# Patient Record
Sex: Male | Born: 1970 | Race: White | Hispanic: No | Marital: Married | Smoking: Current every day smoker
Health system: Southern US, Community
[De-identification: ages and names within clinical notes are randomized; demographics above are authoritative.]

## PROBLEM LIST (undated history)

## (undated) DIAGNOSIS — Z789 Other specified health status: Secondary | ICD-10-CM

## (undated) HISTORY — PX: NO PAST SURGERIES: SHX2092

---

## 1998-07-12 ENCOUNTER — Encounter: Payer: Self-pay | Admitting: Emergency Medicine

## 1998-07-12 ENCOUNTER — Emergency Department (HOSPITAL_COMMUNITY): Admission: EM | Admit: 1998-07-12 | Discharge: 1998-07-12 | Payer: Self-pay | Admitting: Emergency Medicine

## 1998-07-21 ENCOUNTER — Emergency Department (HOSPITAL_COMMUNITY): Admission: EM | Admit: 1998-07-21 | Discharge: 1998-07-21 | Payer: Self-pay

## 2013-08-20 ENCOUNTER — Other Ambulatory Visit: Payer: Self-pay | Admitting: Physical Medicine and Rehabilitation

## 2013-08-20 DIAGNOSIS — M47816 Spondylosis without myelopathy or radiculopathy, lumbar region: Secondary | ICD-10-CM

## 2013-08-24 ENCOUNTER — Inpatient Hospital Stay: Admission: RE | Admit: 2013-08-24 | Payer: Self-pay | Source: Ambulatory Visit

## 2013-08-24 ENCOUNTER — Other Ambulatory Visit: Payer: Self-pay

## 2013-09-23 ENCOUNTER — Other Ambulatory Visit: Payer: Self-pay | Admitting: Neurosurgery

## 2013-10-16 ENCOUNTER — Encounter (HOSPITAL_COMMUNITY): Payer: Self-pay | Admitting: Pharmacy Technician

## 2013-10-19 ENCOUNTER — Other Ambulatory Visit (HOSPITAL_COMMUNITY): Payer: Self-pay | Admitting: *Deleted

## 2013-10-19 ENCOUNTER — Inpatient Hospital Stay (HOSPITAL_COMMUNITY)
Admission: RE | Admit: 2013-10-19 | Discharge: 2013-10-19 | Disposition: A | Discharge status: Home / Self Care | Payer: Self-pay | Source: Ambulatory Visit

## 2013-10-19 NOTE — Pre-Procedure Instructions (Signed)
Ricky Williamson  10/19/2013   Your procedure is scheduled on:  10/26/13  Report to [facility] cone short stay admitting at 530 AM.  Call this number if you have problems the morning of surgery: [phone]   Remember:   Do not eat food or drink liquids after midnight.   Take these medicines the morning of surgery with A SIP OF WATER: allegra, pain med , eye drops if needed     STOP all herbel meds, nsaids (aleve,naproxen,advil,ibuprofen) 5 days prior to surgery including aspirin, vitamins,naproxen, ibuprofen(10/21/13)   Do not wear jewelry, make-up or nail polish.  Do not wear lotions, powders, or perfumes. You may wear deodorant.  Do not shave 48 hours prior to surgery. Men may shave face and neck.  Do not bring valuables to the hospital.  [facility] is not responsible                  for any belongings or valuables.               Contacts, dentures or bridgework may not be worn into surgery.  Leave suitcase in the car. After surgery it may be brought to your room.  For patients admitted to the hospital, discharge time is determined by your                treatment team.               Patients discharged the day of surgery will not be allowed to drive  home.  Name and phone number of your driver:   Special Instructions:  Special Instructions: [place] - Preparing for Surgery  Before surgery, you can play an important role.  Because skin is not sterile, your skin needs to be as free of germs as possible.  You can reduce the number of germs on you skin by washing with CHG (chlorahexidine gluconate) soap before surgery.  CHG is an antiseptic cleaner which kills germs and bonds with the skin to continue killing germs even after washing.  Please DO NOT use if you have an allergy to CHG or antibacterial soaps.  If your skin becomes reddened/irritated stop using the CHG and inform your nurse when you arrive at Short Stay.  Do not shave (including legs and underarms) for at least 48 hours  prior to the first CHG shower.  You may shave your face.  Please follow these instructions carefully:   1.  Shower with CHG Soap the night before surgery and the morning of Surgery.  2.  If you choose to wash your hair, wash your hair first as usual with your normal shampoo.  3.  After you shampoo, rinse your hair and body thoroughly to remove the Shampoo.  4.  Use CHG as you would any other liquid soap.  You can apply chg directly  to the skin and wash gently with scrungie or a clean washcloth.  5.  Apply the CHG Soap to your body ONLY FROM THE NECK DOWN.  Do not use on open wounds or open sores.  Avoid contact with your eyes ears, mouth and genitals (private parts).  Wash genitals (private parts)       with your normal soap.  6.  Wash thoroughly, paying special attention to the area where your surgery will be performed.  7.  Thoroughly rinse your body with warm water from the neck down.  8.  DO NOT shower/wash with your normal soap after using and rinsing off  the CHG Soap.  9.  Pat yourself dry with a clean towel.            10.  Wear clean pajamas.            11.  Place clean sheets on your bed the night of your first shower and do not sleep with pets.  Day of Surgery  Do not apply any lotions/deodorants the morning of surgery.  Please wear clean clothes to the hospital/surgery center.   Please read over the following fact sheets that you were given: Pain Booklet, Coughing and Deep Breathing, Blood Transfusion Information, MRSA Information and Surgical Site Infection Prevention

## 2013-10-22 ENCOUNTER — Encounter (HOSPITAL_COMMUNITY): Payer: Self-pay

## 2013-10-22 ENCOUNTER — Encounter (HOSPITAL_COMMUNITY)
Admission: RE | Admit: 2013-10-22 | Discharge: 2013-10-22 | Disposition: A | Discharge status: Home / Self Care | Payer: Managed Care, Other (non HMO) | Source: Ambulatory Visit | Attending: Neurosurgery | Admitting: Neurosurgery

## 2013-10-22 DIAGNOSIS — M47817 Spondylosis without myelopathy or radiculopathy, lumbosacral region: Secondary | ICD-10-CM | POA: Insufficient documentation

## 2013-10-22 DIAGNOSIS — Z01812 Encounter for preprocedural laboratory examination: Secondary | ICD-10-CM | POA: Diagnosis present

## 2013-10-22 HISTORY — DX: Other specified health status: Z78.9

## 2013-10-22 LAB — CBC WITH DIFFERENTIAL/PLATELET
Basophils Absolute: 0 10*3/uL (ref 0.0–0.1)
Basophils Relative: 1 % (ref 0–1)
EOS PCT: 4 % (ref 0–5)
Eosinophils Absolute: 0.2 10*3/uL (ref 0.0–0.7)
HCT: 43.4 % (ref 39.0–52.0)
Hemoglobin: 14.9 g/dL (ref 13.0–17.0)
LYMPHS PCT: 32 % (ref 12–46)
Lymphs Abs: 1.8 10*3/uL (ref 0.7–4.0)
MCH: 31.9 pg (ref 26.0–34.0)
MCHC: 34.3 g/dL (ref 30.0–36.0)
MCV: 92.9 fL (ref 78.0–100.0)
Monocytes Absolute: 0.5 10*3/uL (ref 0.1–1.0)
Monocytes Relative: 8 % (ref 3–12)
NEUTROS ABS: 3.2 10*3/uL (ref 1.7–7.7)
Neutrophils Relative %: 55 % (ref 43–77)
PLATELETS: 271 10*3/uL (ref 150–400)
RBC: 4.67 MIL/uL (ref 4.22–5.81)
RDW: 12.6 % (ref 11.5–15.5)
WBC: 5.7 10*3/uL (ref 4.0–10.5)

## 2013-10-22 LAB — COMPREHENSIVE METABOLIC PANEL
ALT: 14 U/L (ref 0–53)
AST: 16 U/L (ref 0–37)
Albumin: 4.1 g/dL (ref 3.5–5.2)
Alkaline Phosphatase: 52 U/L (ref 39–117)
Anion gap: 10 (ref 5–15)
BUN: 11 mg/dL (ref 6–23)
CALCIUM: 9.3 mg/dL (ref 8.4–10.5)
CO2: 28 mEq/L (ref 19–32)
Chloride: 104 mEq/L (ref 96–112)
Creatinine, Ser: 1.06 mg/dL (ref 0.50–1.35)
GFR, EST NON AFRICAN AMERICAN: 85 mL/min — AB (ref >90)
GLUCOSE: 107 mg/dL — AB (ref 70–99)
Potassium: 4.5 mEq/L (ref 3.7–5.3)
SODIUM: 142 meq/L (ref 137–147)
Total Bilirubin: <0.2 mg/dL — ABNORMAL LOW (ref 0.3–1.2)
Total Protein: 6.7 g/dL (ref 6.0–8.3)

## 2013-10-22 LAB — TYPE AND SCREEN
ABO/RH(D): O POS
Antibody Screen: NEGATIVE

## 2013-10-22 LAB — SURGICAL PCR SCREEN
MRSA, PCR: POSITIVE — AB
STAPHYLOCOCCUS AUREUS: POSITIVE — AB

## 2013-10-22 LAB — ABO/RH: ABO/RH(D): O POS

## 2013-10-22 NOTE — Pre-Procedure Instructions (Signed)
Ricky Williamson  10/22/2013   Your procedure is scheduled on:  10/26/13  Report to [facility] cone short stay admitting at 530 AM.  Call this number if you have problems the morning of surgery: [phone]   Remember:   Do not eat food or drink liquids after midnight.   Take these medicines the morning of surgery with A SIP OF WATER: allegra, Oxycodone (if needed) , eye drops if needed   STOP Ibuprofen and Naproxen today      STOP/ Do not take Aspirin, Aleve, Naproxen, Advil, Ibuprofen, Motrin, Vitamins, Herbs, or Supplements starting today   Do not wear jewelry, make-up or nail polish.  Do not wear lotions, powders, or perfumes. You may wear deodorant.  Do not shave 48 hours prior to surgery. Men may shave face and neck.  Do not bring valuables to the hospital.  [facility] is not responsible for any belongings or valuables.               Contacts, dentures or bridgework may not be worn into surgery.  Leave suitcase in the car. After surgery it may be brought to your room.  For patients admitted to the hospital, discharge time is determined by your treatment team.                 Special Instructions:   [place] - Preparing for Surgery  Before surgery, you can play an important role.  Because skin is not sterile, your skin needs to be as free of germs as possible.  You can reduce the number of germs on you skin by washing with CHG (chlorahexidine gluconate) soap before surgery.  CHG is an antiseptic cleaner which kills germs and bonds with the skin to continue killing germs even after washing.  Please DO NOT use if you have an allergy to CHG or antibacterial soaps.  If your skin becomes reddened/irritated stop using the CHG and inform your nurse when you arrive at Short Stay.  Do not shave (including legs and underarms) for at least 48 hours prior to the first CHG shower.  You may shave your face.  Please follow these instructions carefully:   1.  Shower with CHG Soap the night  before surgery and the morning of Surgery.  2.  If you choose to wash your hair, wash your hair first as usual with your normal shampoo.  3.  After you shampoo, rinse your hair and body thoroughly to remove the Shampoo.  4.  Use CHG as you would any other liquid soap.  You can apply chg directly  to the skin and wash gently with scrungie or a clean washcloth.  5.  Apply the CHG Soap to your body ONLY FROM THE NECK DOWN.  Do not use on open wounds or open sores.  Avoid contact with your eyes ears, mouth and genitals (private parts).  Wash genitals (private parts)       with your normal soap.  6.  Wash thoroughly, paying special attention to the area where your surgery will be performed.  7.  Thoroughly rinse your body with warm water from the neck down.  8.  DO NOT shower/wash with your normal soap after using and rinsing off the CHG Soap.  9.  Pat yourself dry with a clean towel.            10.  Wear clean pajamas.            11.  Place clean sheets  on your bed the night of your first shower and do not sleep with pets.  Day of Surgery  Do not apply any lotions/deodorants the morning of surgery.  Please wear clean clothes to the hospital/surgery center.   Please read over the following fact sheets that you were given: Pain Booklet, Coughing and Deep Breathing, Blood Transfusion Information and Surgical Site Infection Prevention

## 2013-10-25 MED ORDER — DEXAMETHASONE SODIUM PHOSPHATE 10 MG/ML IJ SOLN
10.0000 mg | INTRAMUSCULAR | Status: AC
  Administered 2013-10-26: 10 mg via INTRAVENOUS
  Filled 2013-10-25: qty 1

## 2013-10-25 MED ORDER — CEFAZOLIN SODIUM-DEXTROSE 2-3 GM-% IV SOLR
2.0000 g | INTRAVENOUS | Status: AC
  Administered 2013-10-26: 2 g via INTRAVENOUS
  Filled 2013-10-25: qty 50

## 2013-10-26 ENCOUNTER — Inpatient Hospital Stay (HOSPITAL_COMMUNITY): Payer: Managed Care, Other (non HMO) | Admitting: Anesthesiology

## 2013-10-26 ENCOUNTER — Encounter (HOSPITAL_COMMUNITY): Payer: Managed Care, Other (non HMO) | Admitting: Anesthesiology

## 2013-10-26 ENCOUNTER — Encounter (HOSPITAL_COMMUNITY)
Admission: RE | Disposition: A | Discharge status: Home / Self Care | Payer: Self-pay | Source: Ambulatory Visit | Attending: Neurosurgery

## 2013-10-26 ENCOUNTER — Inpatient Hospital Stay (HOSPITAL_COMMUNITY): Payer: Managed Care, Other (non HMO)

## 2013-10-26 ENCOUNTER — Inpatient Hospital Stay (HOSPITAL_COMMUNITY)
Admission: RE | Admit: 2013-10-26 | Discharge: 2013-10-27 | DRG: 460 | Disposition: A | Discharge status: Home / Self Care | Payer: Managed Care, Other (non HMO) | Source: Ambulatory Visit | Attending: Neurosurgery | Admitting: Neurosurgery

## 2013-10-26 ENCOUNTER — Encounter (HOSPITAL_COMMUNITY): Payer: Self-pay | Admitting: *Deleted

## 2013-10-26 DIAGNOSIS — M5126 Other intervertebral disc displacement, lumbar region: Principal | ICD-10-CM | POA: Diagnosis present

## 2013-10-26 DIAGNOSIS — M48061 Spinal stenosis, lumbar region without neurogenic claudication: Secondary | ICD-10-CM | POA: Diagnosis present

## 2013-10-26 DIAGNOSIS — Z791 Long term (current) use of non-steroidal anti-inflammatories (NSAID): Secondary | ICD-10-CM | POA: Diagnosis not present

## 2013-10-26 DIAGNOSIS — F17200 Nicotine dependence, unspecified, uncomplicated: Secondary | ICD-10-CM | POA: Diagnosis present

## 2013-10-26 DIAGNOSIS — F1210 Cannabis abuse, uncomplicated: Secondary | ICD-10-CM | POA: Diagnosis present

## 2013-10-26 DIAGNOSIS — Z79899 Other long term (current) drug therapy: Secondary | ICD-10-CM | POA: Diagnosis not present

## 2013-10-26 HISTORY — PX: MAXIMUM ACCESS (MAS)POSTERIOR LUMBAR INTERBODY FUSION (PLIF) 1 LEVEL: SHX6368

## 2013-10-26 SURGERY — FOR MAXIMUM ACCESS (MAS) POSTERIOR LUMBAR INTERBODY FUSION (PLIF) 1 LEVEL
Anesthesia: General | Site: Back

## 2013-10-26 MED ORDER — OXYCODONE-ACETAMINOPHEN 5-325 MG PO TABS
1.0000 | ORAL_TABLET | ORAL | Status: DC | PRN
  Administered 2013-10-26 – 2013-10-27 (×5): 2 via ORAL
  Filled 2013-10-26 (×5): qty 2

## 2013-10-26 MED ORDER — ACETAMINOPHEN 325 MG PO TABS: 650.0000 mg | ORAL_TABLET | ORAL | Status: DC | PRN

## 2013-10-26 MED ORDER — BISACODYL 10 MG RE SUPP: 10.0000 mg | Freq: Every day | RECTAL | Status: DC | PRN

## 2013-10-26 MED ORDER — MUPIROCIN 2 % EX OINT
1.0000 "application " | TOPICAL_OINTMENT | Freq: Two times a day (BID) | CUTANEOUS | Status: DC
  Administered 2013-10-26 – 2013-10-27 (×3): 1 via NASAL
  Filled 2013-10-26: qty 22

## 2013-10-26 MED ORDER — LACTATED RINGERS IV SOLN
INTRAVENOUS | Status: DC | PRN
  Administered 2013-10-26 (×2): via INTRAVENOUS

## 2013-10-26 MED ORDER — SUCCINYLCHOLINE CHLORIDE 20 MG/ML IJ SOLN
INTRAMUSCULAR | Status: AC
  Filled 2013-10-26: qty 1

## 2013-10-26 MED ORDER — POLYETHYLENE GLYCOL 3350 17 G PO PACK
17.0000 g | PACK | Freq: Every day | ORAL | Status: DC | PRN
  Filled 2013-10-26: qty 1

## 2013-10-26 MED ORDER — ALUM & MAG HYDROXIDE-SIMETH 200-200-20 MG/5ML PO SUSP: 30.0000 mL | Freq: Four times a day (QID) | ORAL | Status: DC | PRN

## 2013-10-26 MED ORDER — NEOSTIGMINE METHYLSULFATE 10 MG/10ML IV SOLN
INTRAVENOUS | Status: AC
  Filled 2013-10-26: qty 1

## 2013-10-26 MED ORDER — THROMBIN 20000 UNITS EX SOLR
CUTANEOUS | Status: DC | PRN
  Administered 2013-10-26: 09:00:00 via TOPICAL

## 2013-10-26 MED ORDER — SODIUM CHLORIDE 0.9 % IJ SOLN
3.0000 mL | Freq: Two times a day (BID) | INTRAMUSCULAR | Status: DC
  Administered 2013-10-26: 3 mL via INTRAVENOUS

## 2013-10-26 MED ORDER — LORATADINE 10 MG PO TABS
10.0000 mg | ORAL_TABLET | Freq: Every day | ORAL | Status: DC
  Filled 2013-10-26: qty 1

## 2013-10-26 MED ORDER — FENTANYL CITRATE 0.05 MG/ML IJ SOLN
INTRAMUSCULAR | Status: AC
  Filled 2013-10-26: qty 5

## 2013-10-26 MED ORDER — ONDANSETRON HCL 4 MG/2ML IJ SOLN: 4.0000 mg | INTRAMUSCULAR | Status: DC | PRN

## 2013-10-26 MED ORDER — MUPIROCIN 2 % EX OINT
TOPICAL_OINTMENT | CUTANEOUS | Status: AC
  Filled 2013-10-26: qty 22

## 2013-10-26 MED ORDER — SODIUM CHLORIDE 0.9 % IJ SOLN: 3.0000 mL | INTRAMUSCULAR | Status: DC | PRN

## 2013-10-26 MED ORDER — ARTIFICIAL TEARS OP OINT
TOPICAL_OINTMENT | OPHTHALMIC | Status: AC
Start: 2013-10-26 — End: 2013-10-26
  Filled 2013-10-26: qty 3.5

## 2013-10-26 MED ORDER — DIAZEPAM 5 MG PO TABS
ORAL_TABLET | ORAL | Status: AC
  Filled 2013-10-26: qty 1

## 2013-10-26 MED ORDER — HYDROMORPHONE HCL 1 MG/ML IJ SOLN
0.5000 mg | INTRAMUSCULAR | Status: DC | PRN
  Administered 2013-10-26: 1 mg via INTRAVENOUS
  Filled 2013-10-26: qty 1

## 2013-10-26 MED ORDER — PHENOL 1.4 % MT LIQD: 1.0000 | OROMUCOSAL | Status: DC | PRN

## 2013-10-26 MED ORDER — MIDAZOLAM HCL 5 MG/5ML IJ SOLN
INTRAMUSCULAR | Status: DC | PRN
  Administered 2013-10-26: 2 mg via INTRAVENOUS

## 2013-10-26 MED ORDER — HYDROMORPHONE HCL 1 MG/ML IJ SOLN
INTRAMUSCULAR | Status: AC
  Filled 2013-10-26: qty 1

## 2013-10-26 MED ORDER — ACETAMINOPHEN 650 MG RE SUPP: 650.0000 mg | RECTAL | Status: DC | PRN

## 2013-10-26 MED ORDER — MENTHOL 3 MG MT LOZG: 1.0000 | LOZENGE | OROMUCOSAL | Status: DC | PRN

## 2013-10-26 MED ORDER — SODIUM CHLORIDE 0.9 % IV SOLN: 250.0000 mL | INTRAVENOUS | Status: DC

## 2013-10-26 MED ORDER — OXYCODONE HCL 5 MG PO TABS
ORAL_TABLET | ORAL | Status: AC
  Filled 2013-10-26: qty 1

## 2013-10-26 MED ORDER — PROPOFOL 10 MG/ML IV BOLUS
INTRAVENOUS | Status: AC
  Filled 2013-10-26: qty 20

## 2013-10-26 MED ORDER — FENTANYL CITRATE 0.05 MG/ML IJ SOLN
INTRAMUSCULAR | Status: DC | PRN
  Administered 2013-10-26 (×2): 250 ug via INTRAVENOUS

## 2013-10-26 MED ORDER — EPHEDRINE SULFATE 50 MG/ML IJ SOLN
INTRAMUSCULAR | Status: AC
  Filled 2013-10-26: qty 1

## 2013-10-26 MED ORDER — OXYCODONE HCL 5 MG PO TABS
5.0000 mg | ORAL_TABLET | Freq: Once | ORAL | Status: AC | PRN
  Administered 2013-10-26: 5 mg via ORAL

## 2013-10-26 MED ORDER — 0.9 % SODIUM CHLORIDE (POUR BTL) OPTIME
TOPICAL | Status: DC | PRN
  Administered 2013-10-26: 1000 mL

## 2013-10-26 MED ORDER — SODIUM CHLORIDE 0.9 % IJ SOLN
INTRAMUSCULAR | Status: AC
  Filled 2013-10-26: qty 10

## 2013-10-26 MED ORDER — CHLORHEXIDINE GLUCONATE CLOTH 2 % EX PADS
6.0000 | MEDICATED_PAD | Freq: Every day | CUTANEOUS | Status: DC
  Administered 2013-10-26 – 2013-10-27 (×2): 6 via TOPICAL

## 2013-10-26 MED ORDER — OXYCODONE HCL 5 MG/5ML PO SOLN: 5.0000 mg | Freq: Once | ORAL | Status: AC | PRN

## 2013-10-26 MED ORDER — SODIUM CHLORIDE 0.9 % IR SOLN
Status: DC | PRN
  Administered 2013-10-26: 09:00:00

## 2013-10-26 MED ORDER — MIDAZOLAM HCL 2 MG/2ML IJ SOLN
INTRAMUSCULAR | Status: AC
  Filled 2013-10-26: qty 2

## 2013-10-26 MED ORDER — LIDOCAINE HCL (CARDIAC) 20 MG/ML IV SOLN
INTRAVENOUS | Status: DC | PRN
  Administered 2013-10-26: 100 mg via INTRAVENOUS

## 2013-10-26 MED ORDER — GLYCOPYRROLATE 0.2 MG/ML IJ SOLN
INTRAMUSCULAR | Status: AC
  Filled 2013-10-26: qty 2

## 2013-10-26 MED ORDER — ONDANSETRON HCL 4 MG/2ML IJ SOLN
INTRAMUSCULAR | Status: AC
  Filled 2013-10-26: qty 2

## 2013-10-26 MED ORDER — PROPOFOL 10 MG/ML IV BOLUS
INTRAVENOUS | Status: DC | PRN
  Administered 2013-10-26: 200 mg via INTRAVENOUS

## 2013-10-26 MED ORDER — SENNA 8.6 MG PO TABS
1.0000 | ORAL_TABLET | Freq: Two times a day (BID) | ORAL | Status: DC
  Administered 2013-10-26 – 2013-10-27 (×2): 8.6 mg via ORAL
  Filled 2013-10-26 (×4): qty 1

## 2013-10-26 MED ORDER — LIDOCAINE HCL (CARDIAC) 20 MG/ML IV SOLN
INTRAVENOUS | Status: AC
  Filled 2013-10-26: qty 5

## 2013-10-26 MED ORDER — FLEET ENEMA 7-19 GM/118ML RE ENEM
1.0000 | ENEMA | Freq: Once | RECTAL | Status: AC | PRN
  Filled 2013-10-26: qty 1

## 2013-10-26 MED ORDER — BUPIVACAINE HCL (PF) 0.25 % IJ SOLN
INTRAMUSCULAR | Status: DC | PRN
  Administered 2013-10-26: 20 mL

## 2013-10-26 MED ORDER — ROCURONIUM BROMIDE 50 MG/5ML IV SOLN
INTRAVENOUS | Status: AC
  Filled 2013-10-26: qty 2

## 2013-10-26 MED ORDER — HYDROMORPHONE HCL 1 MG/ML IJ SOLN
0.2500 mg | INTRAMUSCULAR | Status: DC | PRN
  Administered 2013-10-26 (×4): 0.5 mg via INTRAVENOUS

## 2013-10-26 MED ORDER — OXYCODONE-ACETAMINOPHEN 10-325 MG PO TABS: 2.0000 | ORAL_TABLET | Freq: Every evening | ORAL | Status: DC | PRN

## 2013-10-26 MED ORDER — DIAZEPAM 5 MG PO TABS
5.0000 mg | ORAL_TABLET | Freq: Four times a day (QID) | ORAL | Status: DC | PRN
  Administered 2013-10-26: 5 mg via ORAL
  Administered 2013-10-26 – 2013-10-27 (×3): 10 mg via ORAL
  Filled 2013-10-26 (×3): qty 2

## 2013-10-26 MED ORDER — ONDANSETRON HCL 4 MG/2ML IJ SOLN
INTRAMUSCULAR | Status: DC | PRN
  Administered 2013-10-26: 4 mg via INTRAVENOUS

## 2013-10-26 MED ORDER — SUCCINYLCHOLINE CHLORIDE 20 MG/ML IJ SOLN
INTRAMUSCULAR | Status: DC | PRN
  Administered 2013-10-26: 120 mg via INTRAVENOUS

## 2013-10-26 MED ORDER — CEFAZOLIN SODIUM 1-5 GM-% IV SOLN
1.0000 g | Freq: Three times a day (TID) | INTRAVENOUS | Status: AC
  Administered 2013-10-26 (×2): 1 g via INTRAVENOUS
  Filled 2013-10-26 (×2): qty 50

## 2013-10-26 MED ORDER — PROMETHAZINE HCL 25 MG/ML IJ SOLN: 6.2500 mg | INTRAMUSCULAR | Status: DC | PRN

## 2013-10-26 SURGICAL SUPPLY — 68 items
ADH SKN CLS LQ APL DERMABOND (GAUZE/BANDAGES/DRESSINGS) ×1
APL SKNCLS STERI-STRIP NONHPOA (GAUZE/BANDAGES/DRESSINGS) ×1
BAG DECANTER FOR FLEXI CONT (MISCELLANEOUS) ×3 IMPLANT
BENZOIN TINCTURE PRP APPL 2/3 (GAUZE/BANDAGES/DRESSINGS) ×3 IMPLANT
BLADE SURG ROTATE 9660 (MISCELLANEOUS) IMPLANT
BRUSH SCRUB EZ PLAIN DRY (MISCELLANEOUS) ×3 IMPLANT
CAGE COROENT LRG MP 12X9X28-8 (Cage) ×4 IMPLANT
CLIP NEUROVISION LG (CLIP) ×2 IMPLANT
CONT SPEC 4OZ CLIKSEAL STRL BL (MISCELLANEOUS) IMPLANT
COVER BACK TABLE 24X17X13 BIG (DRAPES) IMPLANT
COVER TABLE BACK 60X90 (DRAPES) ×3 IMPLANT
DERMABOND ADHESIVE PROPEN (GAUZE/BANDAGES/DRESSINGS) ×2
DERMABOND ADVANCED .7 DNX6 (GAUZE/BANDAGES/DRESSINGS) IMPLANT
DRAPE C-ARM 42X72 X-RAY (DRAPES) ×3 IMPLANT
DRAPE C-ARMOR (DRAPES) ×3 IMPLANT
DRAPE LAPAROTOMY 100X72X124 (DRAPES) ×3 IMPLANT
DRAPE SURG 17X23 STRL (DRAPES) ×12 IMPLANT
DRSG OPSITE POSTOP 4X6 (GAUZE/BANDAGES/DRESSINGS) ×2 IMPLANT
ELECT BLADE 4.0 EZ CLEAN MEGAD (MISCELLANEOUS)
ELECT REM PT RETURN 9FT ADLT (ELECTROSURGICAL) ×3
ELECTRODE BLDE 4.0 EZ CLN MEGD (MISCELLANEOUS) IMPLANT
ELECTRODE REM PT RTRN 9FT ADLT (ELECTROSURGICAL) ×1 IMPLANT
EVACUATOR 1/8 PVC DRAIN (DRAIN) IMPLANT
GAUZE SPONGE 4X4 12PLY STRL (GAUZE/BANDAGES/DRESSINGS) ×3 IMPLANT
GAUZE SPONGE 4X4 16PLY XRAY LF (GAUZE/BANDAGES/DRESSINGS) IMPLANT
GLOVE BIO SURGEON STRL SZ8.5 (GLOVE) ×2 IMPLANT
GLOVE BIOGEL PI IND STRL 7.0 (GLOVE) IMPLANT
GLOVE BIOGEL PI INDICATOR 7.0 (GLOVE) ×2
GLOVE ECLIPSE 9.0 STRL (GLOVE) ×3 IMPLANT
GLOVE EXAM NITRILE LRG STRL (GLOVE) IMPLANT
GLOVE EXAM NITRILE MD LF STRL (GLOVE) IMPLANT
GLOVE EXAM NITRILE XL STR (GLOVE) IMPLANT
GLOVE EXAM NITRILE XS STR PU (GLOVE) IMPLANT
GLOVE SS BIOGEL STRL SZ 8 (GLOVE) IMPLANT
GLOVE SS N UNI LF 7.0 STRL (GLOVE) ×6 IMPLANT
GLOVE SUPERSENSE BIOGEL SZ 8 (GLOVE) ×2
GOWN STRL REUS W/ TWL LRG LVL3 (GOWN DISPOSABLE) IMPLANT
GOWN STRL REUS W/ TWL XL LVL3 (GOWN DISPOSABLE) ×1 IMPLANT
GOWN STRL REUS W/TWL 2XL LVL3 (GOWN DISPOSABLE) IMPLANT
GOWN STRL REUS W/TWL LRG LVL3 (GOWN DISPOSABLE) ×3
GOWN STRL REUS W/TWL XL LVL3 (GOWN DISPOSABLE) ×6
GRAFT BN 5X1XSPNE CVD POST DBM (Bone Implant) IMPLANT
GRAFT BONE MAGNIFUSE 1X5CM (Bone Implant) ×3 IMPLANT
KIT BASIN OR (CUSTOM PROCEDURE TRAY) ×3 IMPLANT
KIT NDL NVM5 EMG ELECT (KITS) IMPLANT
KIT NEEDLE NVM5 EMG ELECT (KITS) ×1 IMPLANT
KIT NEEDLE NVM5 EMG ELECTRODE (KITS) ×2
KIT ROOM TURNOVER OR (KITS) ×3 IMPLANT
NEEDLE HYPO 22GX1.5 SAFETY (NEEDLE) ×3 IMPLANT
NS IRRIG 1000ML POUR BTL (IV SOLUTION) ×3 IMPLANT
PACK LAMINECTOMY NEURO (CUSTOM PROCEDURE TRAY) ×3 IMPLANT
ROD PREBENT 45MM LUMBAR (Rod) ×4 IMPLANT
SCREW LOCK (Screw) ×12 IMPLANT
SCREW LOCK FXNS SPNE MAS PL (Screw) IMPLANT
SCREW MAS PLIF 5.5X30 (Screw) ×4 IMPLANT
SCREW PAS PLIF 5X30 (Screw) IMPLANT
SCREW SHANK 5.0X35 (Screw) ×4 IMPLANT
SCREW TULIP 5.5 (Screw) ×4 IMPLANT
SPONGE LAP 4X18 X RAY DECT (DISPOSABLE) IMPLANT
SPONGE SURGIFOAM ABS GEL SZ50 (HEMOSTASIS) IMPLANT
SUT VIC AB 2-0 CT1 18 (SUTURE) ×5 IMPLANT
SUT VIC AB 3-0 SH 8-18 (SUTURE) ×3 IMPLANT
SYR 20ML ECCENTRIC (SYRINGE) ×3 IMPLANT
TAPE STRIPS DRAPE STRL (GAUZE/BANDAGES/DRESSINGS) ×2 IMPLANT
TOWEL OR 17X24 6PK STRL BLUE (TOWEL DISPOSABLE) ×3 IMPLANT
TOWEL OR 17X26 10 PK STRL BLUE (TOWEL DISPOSABLE) ×3 IMPLANT
TRAY FOLEY CATH 14FRSI W/METER (CATHETERS) IMPLANT
WATER STERILE IRR 1000ML POUR (IV SOLUTION) ×3 IMPLANT

## 2013-10-26 NOTE — Brief Op Note (Signed)
10/26/2013  9:53 AM  PATIENT:  Ricky Williamson  43 y.o. male  PRE-OPERATIVE DIAGNOSIS:  spondylosis  POST-OPERATIVE DIAGNOSIS:  spondylosis  PROCEDURE:  Procedure(s) with comments: FOR MAXIMUM ACCESS (MAS) POSTERIOR LUMBAR INTERBODY FUSION (PLIF) 1 LEVEL (N/A) - FOR MAXIMUM ACCESS (MAS) POSTERIOR LUMBAR INTERBODY FUSION (PLIF) 1 LEVEL L3-4  SURGEON:  Surgeon(s) and Role:    * Temple Pacini, MD - Primary    * Tressie Stalker, MD - Assisting  PHYSICIAN ASSISTANT:   ASSISTANTS:    ANESTHESIA:   general  EBL:  Total I/O In: 1000 [I.V.:1000] Out: -   BLOOD ADMINISTERED:none  DRAINS: none   LOCAL MEDICATIONS USED:  MARCAINE     SPECIMEN:  No Specimen  DISPOSITION OF SPECIMEN:  N/A  COUNTS:  YES  TOURNIQUET:  * No tourniquets in log *  DICTATION: .Dragon Dictation  PLAN OF CARE: Admit to inpatient   PATIENT DISPOSITION:  PACU - hemodynamically stable.   Delay start of Pharmacological VTE agent (>24hrs) due to surgical blood loss or risk of bleeding: yes

## 2013-10-26 NOTE — Anesthesia Postprocedure Evaluation (Signed)
  Anesthesia Post-op Note  Patient: Ricky Williamson  Procedure(s) Performed: Procedure(s) with comments: FOR MAXIMUM ACCESS (MAS) POSTERIOR LUMBAR INTERBODY FUSION (PLIF) 1 LEVEL (N/A) - FOR MAXIMUM ACCESS (MAS) POSTERIOR LUMBAR INTERBODY FUSION (PLIF) 1 LEVEL L3-4  Patient Location: PACU  Anesthesia Type:General  Level of Consciousness: awake, alert  and oriented  Airway and Oxygen Therapy: Patient Spontanous Breathing  Post-op Pain: mild  Post-op Assessment: Post-op Vital signs reviewed  Post-op Vital Signs: Reviewed  Last Vitals:  Filed Vitals:   10/26/13 1115  BP:   Pulse: 67  Temp:   Resp: 15    Complications: No apparent anesthesia complications

## 2013-10-26 NOTE — Progress Notes (Signed)
PT STILL 10/10 AFTER MEDS, dR fITZGERALD AWARE AND ORDERS NOTED

## 2013-10-26 NOTE — Op Note (Signed)
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  L3/4 posterior lumbar interbody fusion utilizing interbody peek cage and locally harvested autograft.  L3 4 posterior lateral arthrodesis utilizing nonsegmental pedicle screw fixation and local autograft and master graft bone graft extender  Surgeon:Dodd Schmid A.Shaquasia Caponigro, M.D.  Asst. Surgeon: Lovell Sheehan  Anesthesia: General  Indication: 43 year old male with intractable back pain intermittent left lower kidney symptoms failing all conservative management. Workup has demonstrated evidence of lateral annular tear with associated disc herniation L3-4 which reacts concordantly with provocative disc injection. Patient has failed all conservative management and presents now for L3 for decompression infusion in hopes of improving his symptoms.  Operative note: After induction of anesthesia, patient positioned prone onto Wilson frame and appropriately padded. Lumbar region prepped and draped sterilely. Incision made overlying L3-4. Dissection performed. Retractor placed. X-ray taken. Level confirmed. Pilot holes drilled into the inferior medial aspect of each pedicle under fluoroscopic guidance with continuous neural monitoring. Pilot holes were then extended through the course of the pedicle and a superior lateral trajectory. Each pilot hole was probed and found to be solidly within the bone. Pilot hole was then tapped. Each tap screw hole was once again probed and found to be solidly within bone. 5.0 mm cortical screws were placed into the pedicles of L3 bilaterally. Decompressive laminectomies then performed using Leksell  rongeurs [employer] years high-speed drill to remove the inferior three quarters of the lamina of L3 entire inferior facet of L3 bilaterally. Superior facet of L4 bilaterally and the superior rim of the L4 lamina. Ligament flavum was elevated and resected piecemeal fashion. Wide decompressive foraminotomies were performed on course the exiting L3 and L4 nerve roots bilaterally, more than would be required for simple interbody fusion alone. Bilateral discectomies were then performed. The space and distraction up to 12 mm. With a 12 mm distractor less than patient's left side thecal sac and nerve roots were protected on the right side. The space was reamed and then scraped clean with various curettes. Soft tissues removed and interspace. A 12 x 28 x 8 degree cage packed with morselized autograft was then impacted into place and rotated in position. Fluoroscopy revealed good position of the cage and autograft. Distractor was removed and patient's left side. Thecal sac or respect on the left side. The space was again prepared on the left side. Morselized autograft packed into the interspace for later fusion. A second cage packed with morselized autograft was impacted into place and rotated position. Pedicles of L4 were then applied using surface landmarks and intraoperative fluoroscopy. Pilot holes were drilled into the pedicles of L4. Each pilot hole was probed and found to be solidly within bone. Pilot hole was then tapped and then 5.0 mm x 30 mm nuvasive screws were placed. Final images real good position of the bone graft and hardware at proper upper level with normal alignment of the spine. Wound is irrigated one final time. Transverse processes and lateral facets were more decorticated using high-speed drill. Morselized autograft and [employer] bone in the bag master graft was packed posterior laterally. Short segment titanium rods and placed over the screw heads at L3 and L4. Locking caps were then placed over the  screws. Locking caps and engaged with  the construct under compression. Wound is then irrigated one last time. Gelfoam was placed topically for hemostasis. Wounds and close in layers with Vicryl sutures. Steri-Strips and sterile dressing were applied. No apparent complications. Patient tolerated the procedure well and he returns to the recovery room postop.

## 2013-10-26 NOTE — Progress Notes (Signed)
Utilization review completed.  

## 2013-10-26 NOTE — Progress Notes (Signed)
PT STILL STATING HE IS A 10, PT ABLE TO CONVERSE AND FALL ASLEEP PERIODICALLY

## 2013-10-26 NOTE — H&P (Signed)
Ricky Williamson is an 43 y.o. male.   Chief Complaint: Back pain HPI: Patient is a 43 year old male with over one-year history of intractable back pain failing all conservative management. Workup demonstrates evidence of early disc generation L5-S1. The patient was evaluated with lumbar discography. Interestingly alert discography was negative at both L4-5 and L5-S1 however it was very strongly concordant at L3-4 with a large left lateral annular tear. The patient is once again failed all conservative management. We discussed options for treatment. Patient recognizes the limitations of fusion surgery for discographic pain positive studies however he feels that his symptoms are such that he is willing to take the risk of this may not be of benefit. He has no symptoms or weakness. He has no significant sensory loss.  Past Medical History  Diagnosis Date  . Medical history non-contributory     Past Surgical History  Procedure Laterality Date  . No past surgeries      History reviewed. No pertinent family history. Social History:  reports that he has been smoking.  He does not have any smokeless tobacco history on file. He reports that he uses illicit drugs (Marijuana). His alcohol history is not on file.  Allergies: No Known Allergies  Medications Prior to Admission  Medication Sig Dispense Refill  . Fexofenadine HCl (ALLEGRA PO) Take 1 tablet by mouth daily.      Marland Kitchen ibuprofen (ADVIL,MOTRIN) 200 MG tablet Take 800 mg by mouth every 6 (six) hours as needed for moderate pain.      . naproxen sodium (ANAPROX) 220 MG tablet Take 440 mg by mouth 2 (two) times daily as needed (for pain).      Marland Kitchen oxyCODONE-acetaminophen (PERCOCET) 10-325 MG per tablet Take 2 tablets by mouth at bedtime as needed for pain.      . Tetrahydrozoline HCl (VISINE OP) Place 3 drops into both eyes daily as needed (for allergies).        No results found for this or any previous visit (from the past 48 hour(s)). No results  found.  Review of Systems  Constitutional: Negative.   HENT: Negative.   Eyes: Negative.   Respiratory: Negative.   Cardiovascular: Negative.   Gastrointestinal: Negative.   Genitourinary: Negative.   Musculoskeletal: Negative.   Skin: Negative.   Neurological: Negative.   Endo/Heme/Allergies: Negative.   Psychiatric/Behavioral: Negative.     Blood pressure 121/75, temperature 97.9 F (36.6 C), temperature source Oral, resp. rate 16, weight 97.07 kg (214 lb), SpO2 97.00%. Physical Exam  Constitutional: He is oriented to person, place, and time. He appears well-developed and well-nourished. No distress.  HENT:  Head: Normocephalic and atraumatic.  Right Ear: External ear normal.  Left Ear: External ear normal.  Nose: Nose normal.  Mouth/Throat: Oropharynx is clear and moist. No oropharyngeal exudate.  Eyes: Conjunctivae and EOM are normal. Pupils are equal, round, and reactive to light. Right eye exhibits no discharge. Left eye exhibits no discharge.  Neck: Normal range of motion. Neck supple. No tracheal deviation present. No thyromegaly present.  Cardiovascular: Normal rate, regular rhythm, normal heart sounds and intact distal pulses.  Exam reveals no friction rub.   No murmur heard. Respiratory: Effort normal and breath sounds normal. No respiratory distress. He has no wheezes.  GI: Soft. Bowel sounds are normal. He exhibits no distension. There is no tenderness.  Musculoskeletal: Normal range of motion. He exhibits no edema and no tenderness.  Neurological: He is alert and oriented to person, place, and time. He  has normal reflexes. He displays normal reflexes. No cranial nerve deficit. He exhibits normal muscle tone. Coordination normal.  Skin: Skin is warm and dry. No rash noted. He is not diaphoretic. No erythema. No pallor.  Psychiatric: He has a normal mood and affect. His behavior is normal. Judgment and thought content normal.     Assessment/Plan L3-4 annular tear  with intractable pain and some degree of foraminal stenosis. Plan L3-4 decompressive laminectomy with posterior lumbar interbody fusion utilizing interbody peek cage, local harvested autograft, and augmented by posterior lateral spaces utilizing nonsegmental pedicle screw fixation. Risks and benefits been explained. Patient wishes to proceed.  Ricky Williamson A 10/26/2013, 7:39 AM

## 2013-10-26 NOTE — Evaluation (Signed)
Occupational Therapy Evaluation Patient Details Name: Ricky Williamson MRN: [id number] DOB: 01/14/1971 Today's Date: 10/26/2013    History of Present Illness 43 y.o. s/p MAXIMUM ACCESS (MAS) POSTERIOR LUMBAR INTERBODY FUSION (PLIF) 1 LEVEL L3-4.   Clinical Impression   Pt s/p above. Education provided to pt and spouse and they verbalized understanding. No further OT needs.    Follow Up Recommendations  No OT follow up;Supervision - Intermittent    Equipment Recommendations  None recommended by OT    Recommendations for Other Services       Precautions / Restrictions Precautions Precautions: Back Precaution Booklet Issued: Yes (comment) Precaution Comments: Educated on precautions Required Braces or Orthoses: Spinal Brace Spinal Brace: Lumbar corset;Applied in sitting position Restrictions Weight Bearing Restrictions: No      Mobility Bed Mobility Overal bed mobility: Needs Assistance Bed Mobility: Rolling;Sidelying to Sit;Sit to Sidelying Rolling: Modified independent (Device/Increase time) Sidelying to sit: Supervision     Sit to sidelying: Modified independent (Device/Increase time)    Transfers Overall transfer level: Modified independent                    Balance                                            ADL Overall ADL's : Needs assistance/impaired                 Upper Body Dressing : Set up;Sitting;Supervision/safety (back brace)   Lower Body Dressing: Set up;Supervision/safety;Sit to/from stand   Toilet Transfer: Supervision/safety;Ambulation (bed)   Toileting- Clothing Manipulation and Hygiene: Supervision/safety (standing)   Tub/ Shower Transfer: Supervision/safety;Ambulation   Functional mobility during ADLs: Supervision/safety;Modified independent General ADL Comments: Educated on use of cup for teeth care and placement of grooming items to avoid breaking precautions. Educated on back brace. Discussed  incorporating back precautions into functional activities. Educated on AE for LB ADLs and multiple uses of reacher. Pt able to cross legs over knees but reports he was having to lift quite a bit, so educated on AE. Educated on safe technique for tub transfer and recommended spouse be with him. Recommended sitting on chair for LB bathing and discussed what pt could use for shower chair. educated on safety tips.     Vision                     Perception     Praxis      Pertinent Vitals/Pain Pain Assessment: 0-10 Pain Score:  (9.5) Pain Location: back Pain Intervention(s): Repositioned;Monitored during session     Hand Dominance Left   Extremity/Trunk Assessment Upper Extremity Assessment Upper Extremity Assessment: Overall WFL for tasks assessed   Lower Extremity Assessment Lower Extremity Assessment: Overall WFL for tasks assessed       Communication Communication Communication: No difficulties   Cognition Arousal/Alertness: Awake/alert Behavior During Therapy: WFL for tasks assessed/performed Overall Cognitive Status: Within Functional Limits for tasks assessed                     General Comments       Exercises       Shoulder Instructions      Home Living Family/patient expects to be discharged to:: Private residence Living Arrangements: Spouse/significant other Available Help at Discharge: Family;Available 24 hours/day Type of Home: House Home Access: Stairs to enter Entrance  Stairs-Number of Steps: 5 Entrance Stairs-Rails: Right;Left;Can reach both Home Layout: One level     Bathroom Shower/Tub: Chief Strategy Officer: Handicapped height     Home Equipment:  (access to [facility])          Prior Functioning/Environment Level of Independence: Independent             OT Diagnosis:     OT Problem List:     OT Treatment/Interventions:      OT Goals(Current goals can be found in the care plan section)    OT Frequency:      Barriers to D/C:            Co-evaluation              End of Session Equipment Utilized During Treatment: Gait belt;Back brace  Activity Tolerance: Patient tolerated treatment well Patient left: in bed;with call bell/phone within reach;with family/visitor present   Time: 1502-1530 OT Time Calculation (min): 28 min Charges:  OT General Charges $OT Visit: 1 Procedure OT Evaluation $Initial OT Evaluation Tier I: 1 Procedure OT Treatments $Self Care/Home Management : 8-22 mins G-CodesEarlie Raveling OTR/L [phone] 10/26/2013, 4:41 PM

## 2013-10-26 NOTE — Anesthesia Preprocedure Evaluation (Signed)
Anesthesia Evaluation  Patient identified by MRN, date of birth, ID band Patient awake    Reviewed: Allergy & Precautions, H&P , NPO status , Patient's Chart, lab work & pertinent test results  Airway Mallampati: III TM Distance: >3 FB Neck ROM: Full    Dental  (+) Teeth Intact, Dental Advisory Given   Pulmonary Current Smoker,  breath sounds clear to auscultation        Cardiovascular negative cardio ROS  Rhythm:Regular Rate:Normal     Neuro/Psych negative neurological ROS  negative psych ROS   GI/Hepatic negative GI ROS, Neg liver ROS,   Endo/Other  negative endocrine ROS  Renal/GU negative Renal ROS  negative genitourinary   Musculoskeletal negative musculoskeletal ROS (+)   Abdominal   Peds  Hematology negative hematology ROS (+)   Anesthesia Other Findings   Reproductive/Obstetrics negative OB ROS                           Anesthesia Physical Anesthesia Plan  ASA: I  Anesthesia Plan: General   Post-op Pain Management:    Induction: Intravenous  Airway Management Planned: Oral ETT  Additional Equipment:   Intra-op Plan:   Post-operative Plan: Extubation in OR  Informed Consent: I have reviewed the patients History and Physical, chart, labs and discussed the procedure including the risks, benefits and alternatives for the proposed anesthesia with the patient or authorized representative who has indicated his/her understanding and acceptance.   Dental advisory given  Plan Discussed with: CRNA, Anesthesiologist and Surgeon  Anesthesia Plan Comments:         Anesthesia Quick Evaluation

## 2013-10-26 NOTE — Transfer of Care (Signed)
Immediate Anesthesia Transfer of Care Note  Patient: Ricky Williamson  Procedure(s) Performed: Procedure(s) with comments: FOR MAXIMUM ACCESS (MAS) POSTERIOR LUMBAR INTERBODY FUSION (PLIF) 1 LEVEL (N/A) - FOR MAXIMUM ACCESS (MAS) POSTERIOR LUMBAR INTERBODY FUSION (PLIF) 1 LEVEL L3-4  Patient Location: PACU  Anesthesia Type:General  Level of Consciousness: awake, alert  and oriented  Airway & Oxygen Therapy: Patient Spontanous Breathing and Patient connected to nasal cannula oxygen  Post-op Assessment: Report given to PACU RN and Post -op Vital signs reviewed and stable  Post vital signs: Reviewed and stable  Complications: No apparent anesthesia complications

## 2013-10-27 ENCOUNTER — Encounter (HOSPITAL_COMMUNITY): Payer: Self-pay | Admitting: Neurosurgery

## 2013-10-27 MED ORDER — OXYCODONE-ACETAMINOPHEN 10-325 MG PO TABS: 1.0000 | ORAL_TABLET | ORAL | Status: AC | PRN

## 2013-10-27 MED ORDER — DIAZEPAM 5 MG PO TABS: 5.0000 mg | ORAL_TABLET | Freq: Four times a day (QID) | ORAL | Status: AC | PRN

## 2013-10-27 NOTE — Discharge Instructions (Signed)
Wound Care °Keep incision covered and dry for two days.  If you shower, cover incision with plastic wrap.  °Do not put any creams, lotions, or ointments on incision. °Leave steri-strips on back.  They will fall off by themselves. °Activity °Walk each and every day, increasing distance each day. °No lifting greater than 5 lbs.  Avoid excessive neck motion. °No driving for 2 weeks; may ride as a passenger locally. °If provided with back brace, wear when out of bed.  It is not necessary to wear brace in bed. °Diet °Resume your normal diet.  °Return to Work °Will be discussed at you follow up appointment. °Call Your Doctor If Any of These Occur °Redness, drainage, or swelling at the wound.  °Temperature greater than 101 degrees. °Severe pain not relieved by pain medication. °Incision starts to come apart. °Follow Up Appt °Call today for appointment in 1-2 weeks ([phone]) or for problems.  If you have any hardware placed in your spine, you will need an x-ray before your appointment. ° ° °Spinal Fusion °Spinal fusion is a procedure to make 2 or more of the bones in your spinal column (vertebrae) grow together (fuse). This procedure stops movement between the vertebrae and can relieve pain and prevent deformity.  °Spinal fusion is used to treat the following conditions: °· Fractures of the spine. °· Herniated disk (the spongy material [cartilage] between the vertebrae). °· Abnormal curvatures of the spine, such as scoliosis or kyphosis. °· A weak or an unstable spine, caused by infections or tumor. °RISKS AND COMPLICATIONS °Complications associated with spinal fusion are rare, but they can occur. Possible complications include: °· Bleeding. °· Infection near the incision. °· Nerve damage. Signs of nerve damage are back pain, pain in one or both legs, weakness, or numbness. °· Spinal fluid leakage. °· Blood clot in your leg, which can move to your lungs. °· Difficulty controlling urination or bowel movements. °BEFORE THE  PROCEDURE °· A medical evaluation will be done. This will include a physical exam, blood tests, and imaging exams. °· You will talk with an anesthesiologist. This is the person who will be in charge of the anesthesia during the procedure. Spinal fusion usually requires that you are asleep during the procedure (general anesthesia). °· You will need to stop taking certain medicines, particularly those associated with an increased risk of bleeding. Ask your caregiver about changing or stopping your regular medicines. °· If you smoke, you will need to stop at least 2 weeks before the procedure. Smoking can slow down the healing process, especially fusion of the vertebrae, and increase the risk of complications. °· Do not eat or drink anything for at least 8 hours before the procedure. °PROCEDURE  °A cut (incision) is made over the vertebrae that will be fused. The back muscles are separated from the vertebrae. If you are having this procedure to treat a herniated disk, the disc material pressing on the nerve root is removed (decompression). The area where the disk is removed is then filled with extra bone. Bone from another part of your body (autogenous bone) or bone from a bone donor (allograft bone) may be used. The extra bone promotes fusion between the vertebrae. Sometimes, specific medicines are added to the fusion area to promote bone healing. In most cases, screws and rods or metal plates will be used to attach the vertebrae to stabilize them while they fuse.  °AFTER THE PROCEDURE  °· You will stay in a recovery area until the anesthesia has worn   off. Your blood pressure and pulse will be checked frequently. °· You will be given antibiotics to prevent infection. °· You may continue to receive fluids through an intravenous (IV) tube while you are still in the hospital. °· Pain after surgery is normal. You will be given pain medicine. °· You will be taught how to move correctly and how to stand and walk. While in  bed, you will be instructed to turn frequently, using a "log rolling" technique, in which the entire body is moved without twisting the back. °Document Released: 10/21/2002 Document Revised: 04/16/2011 Document Reviewed: 04/06/2010 °[employer]® Patient Information ©2015 [employer]. This information is not intended to replace advice given to you by your health care provider. Make sure you discuss any questions you have with your health care provider. ° °

## 2013-10-27 NOTE — Progress Notes (Signed)
Pt doing well. Pt and wife given D/C instructions with Rx's, verbal understanding was provided. Pt's incision is clean, dry, and intact and showed no sign of infection. Pt's IV was removed prior to D/C. Pt D/C'd home via walking at 1030 per MD order. Pt is stable @ D/C and has no other needs at this time. Pt had corsett brace when D/C'd. Rema Fendt, RN

## 2013-10-27 NOTE — Progress Notes (Signed)
PT Cancellation Note  Patient Details Name: ALEXANDAR WEISENBERGER MRN: [id number] DOB: [birth year]   Cancelled Treatment:    Reason Eval/Treat Not Completed: PT screened, no needs identified, will sign off   Fabricio Endsley 10/27/2013, 9:44 AM

## 2013-10-27 NOTE — Discharge Summary (Signed)
Physician Discharge Summary  Patient ID: Ricky Williamson MRN: [id number] DOB/AGE: [birth year] 43 y.o.  Admit date: 10/26/2013 Discharge date: 10/27/2013  Admission Diagnoses:  Discharge Diagnoses:  Active Problems:   Lumbar foraminal stenosis   Discharged Condition: good  Hospital Course: The patient was admitted to the hospital where he underwent uncomplicated L3 for decompression and fusion. Postoperatively he is done well. Preoperative back and leg pain are improved. He is up ambulating without difficulty and is ready for discharge home.  Consults:   Significant Diagnostic Studies:   Treatments:   Discharge Exam: Blood pressure 104/66, pulse 70, temperature 98.1 F (36.7 C), temperature source Oral, resp. rate 18, weight 97.07 kg (214 lb), SpO2 100.00%. Patient is awake and alert. Oriented and appropriate. Motor and sensory function intact. Wound clean and dry. Chest and abdomen benign.  Disposition: Final discharge disposition not confirmed     Medication List         ALLEGRA PO  Take 1 tablet by mouth daily.     diazepam 5 MG tablet  Commonly known as:  VALIUM  Take 1-2 tablets (5-10 mg total) by mouth every 6 (six) hours as needed for muscle spasms.     ibuprofen 200 MG tablet  Commonly known as:  ADVIL,MOTRIN  Take 800 mg by mouth every 6 (six) hours as needed for moderate pain.     naproxen sodium 220 MG tablet  Commonly known as:  ANAPROX  Take 440 mg by mouth 2 (two) times daily as needed (for pain).     oxyCODONE-acetaminophen 10-325 MG per tablet  Commonly known as:  PERCOCET  Take 1-2 tablets by mouth every 4 (four) hours as needed for pain.     VISINE OP  Place 3 drops into both eyes daily as needed (for allergies).         Signed: Hartman Minahan A 10/27/2013, 10:11 AM

## 2018-12-08 ENCOUNTER — Other Ambulatory Visit: Payer: Self-pay

## 2018-12-08 DIAGNOSIS — Z20822 Contact with and (suspected) exposure to covid-19: Secondary | ICD-10-CM

## 2018-12-09 LAB — NOVEL CORONAVIRUS, NAA: SARS-CoV-2, NAA: NOT DETECTED

## 2018-12-17 DIAGNOSIS — N63 Unspecified lump in unspecified breast: Secondary | ICD-10-CM | POA: Diagnosis not present

## 2018-12-18 ENCOUNTER — Other Ambulatory Visit: Payer: Self-pay | Admitting: Family Medicine

## 2018-12-18 DIAGNOSIS — N6310 Unspecified lump in the right breast, unspecified quadrant: Secondary | ICD-10-CM

## 2018-12-22 ENCOUNTER — Other Ambulatory Visit: Payer: Managed Care, Other (non HMO)

## 2018-12-29 ENCOUNTER — Ambulatory Visit: Payer: Managed Care, Other (non HMO)

## 2018-12-29 ENCOUNTER — Other Ambulatory Visit: Payer: Self-pay

## 2018-12-29 ENCOUNTER — Ambulatory Visit
Admission: RE | Admit: 2018-12-29 | Discharge: 2018-12-29 | Disposition: A | Discharge status: Home / Self Care | Payer: BC Managed Care – PPO | Source: Ambulatory Visit | Attending: Family Medicine | Admitting: Family Medicine

## 2018-12-29 DIAGNOSIS — N6310 Unspecified lump in the right breast, unspecified quadrant: Secondary | ICD-10-CM

## 2018-12-29 DIAGNOSIS — R928 Other abnormal and inconclusive findings on diagnostic imaging of breast: Secondary | ICD-10-CM | POA: Diagnosis not present

## 2018-12-29 DIAGNOSIS — N62 Hypertrophy of breast: Secondary | ICD-10-CM | POA: Diagnosis not present

## 2020-06-09 IMAGING — MG DIGITAL DIAGNOSTIC BILAT W/ TOMO W/ CAD
8 series · 8 of 24 positions shown · non-contrast
Comparison: None

CLINICAL DATA: Patient has noticed enlargement of the right breast
and tenderness for about 1 month.

EXAM:
DIGITAL DIAGNOSTIC BILATERAL MAMMOGRAM WITH CAD AND TOMO

[R MLO synth-2D]
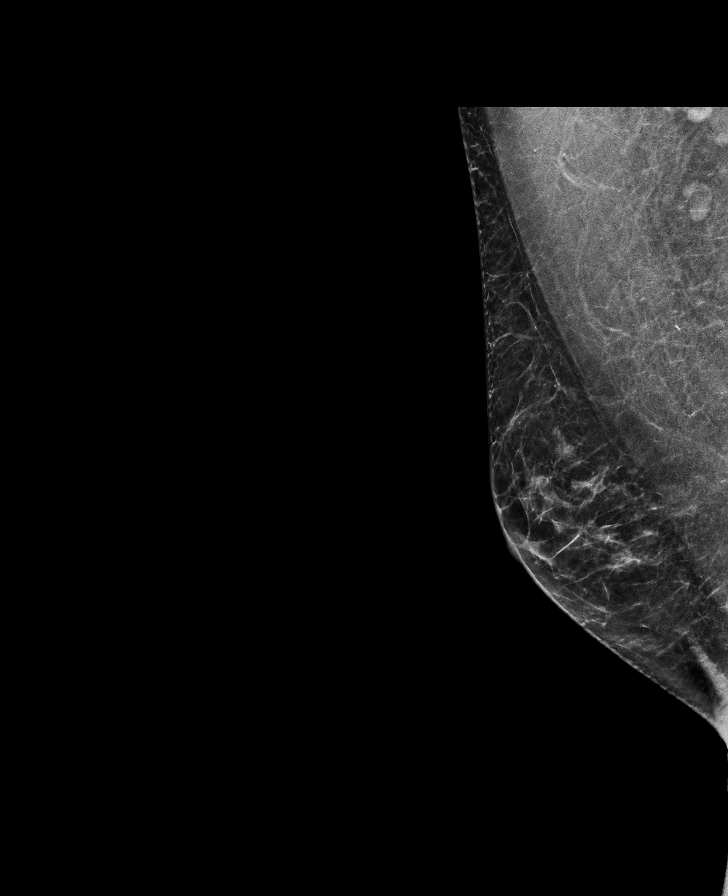

[L MLO synth-2D]
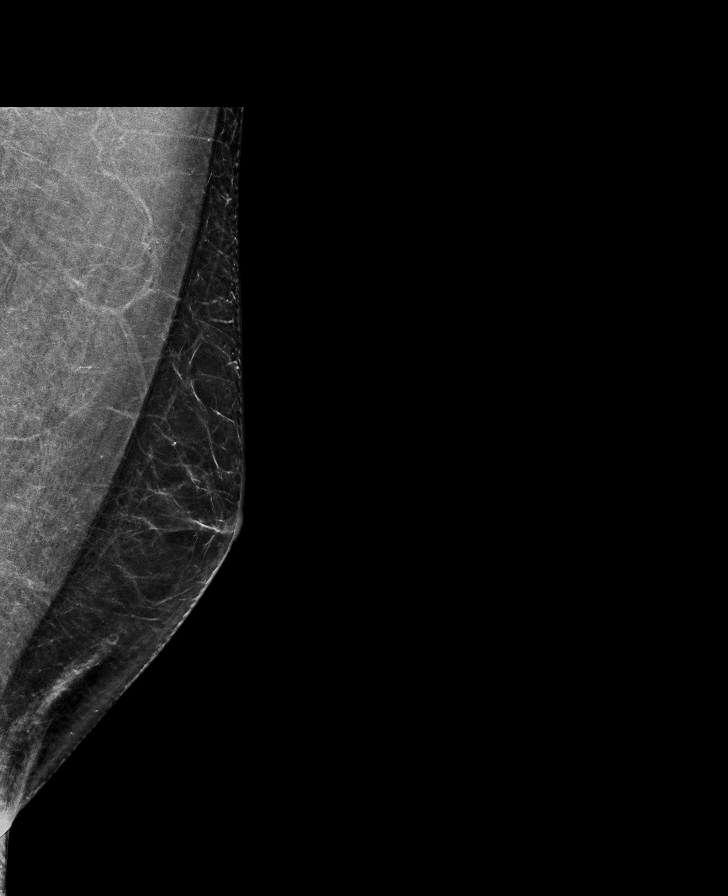

[R CC synth-2D]
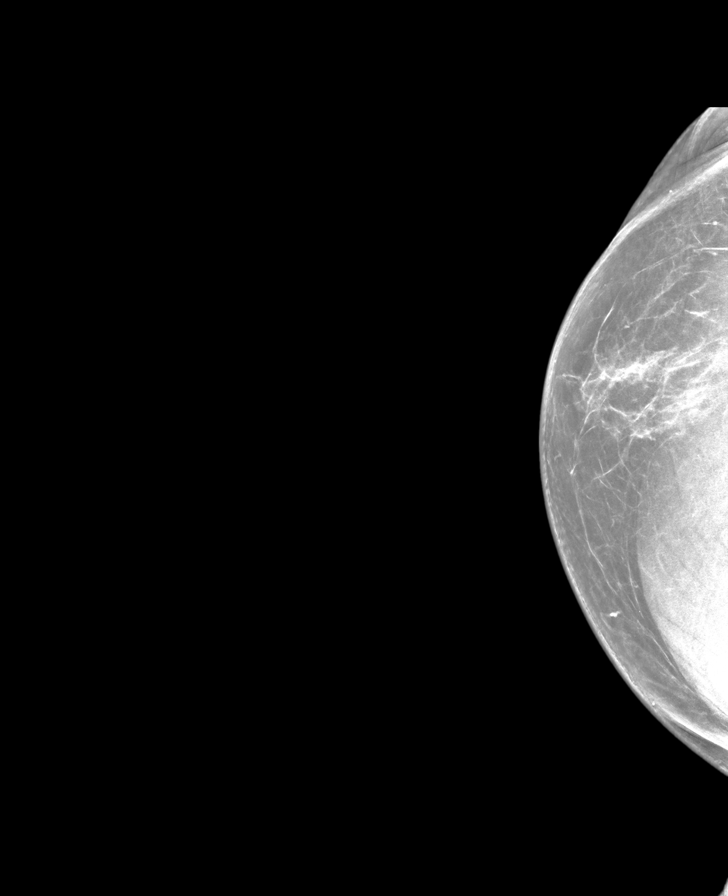

[L CC synth-2D]
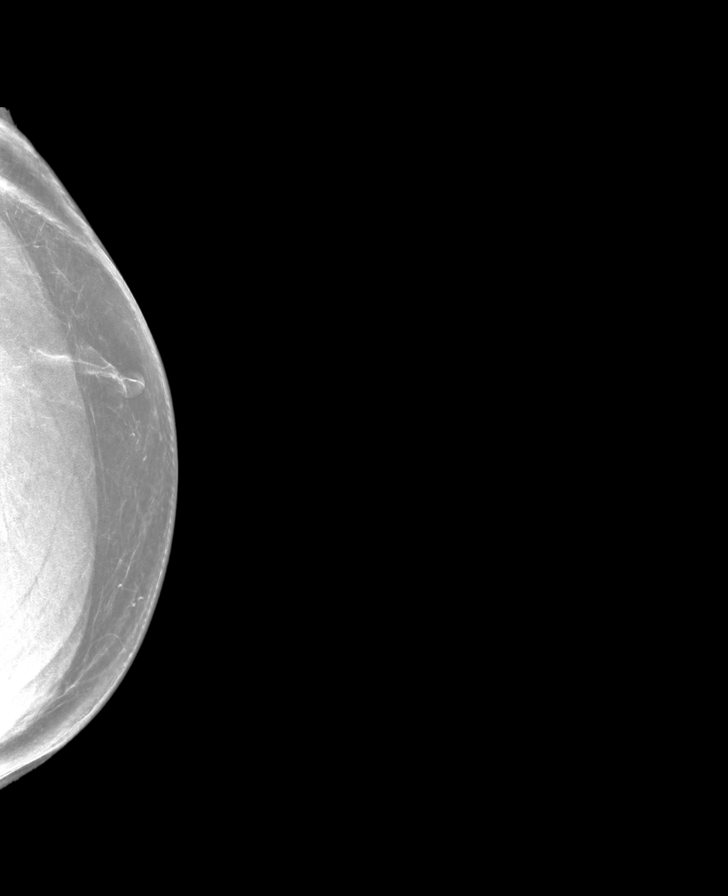

[L MLO tomo · tomo slice 33/64.0]
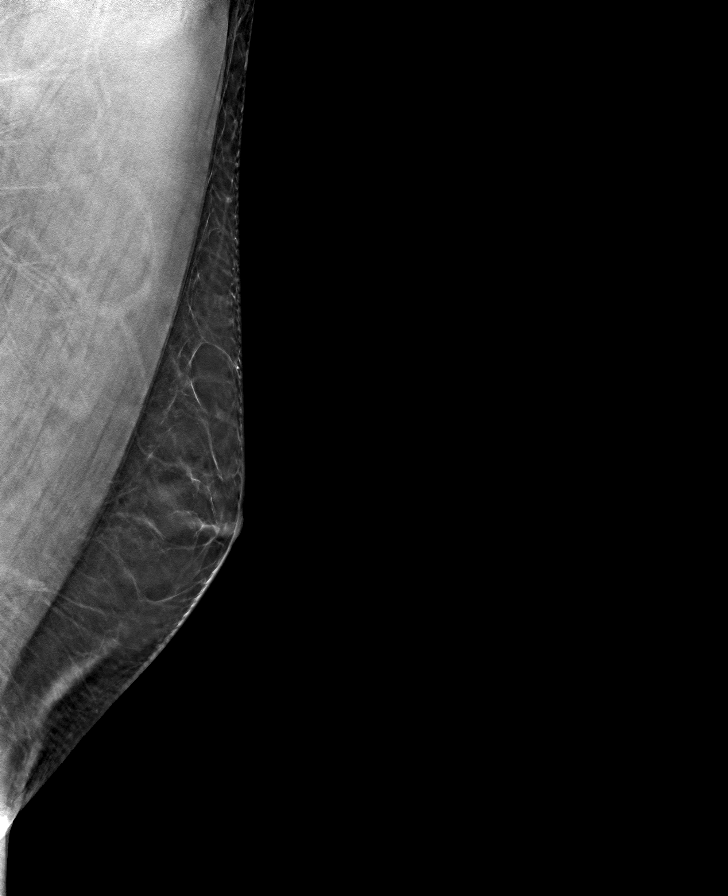

[R CC tomo · tomo slice 34/67.0]
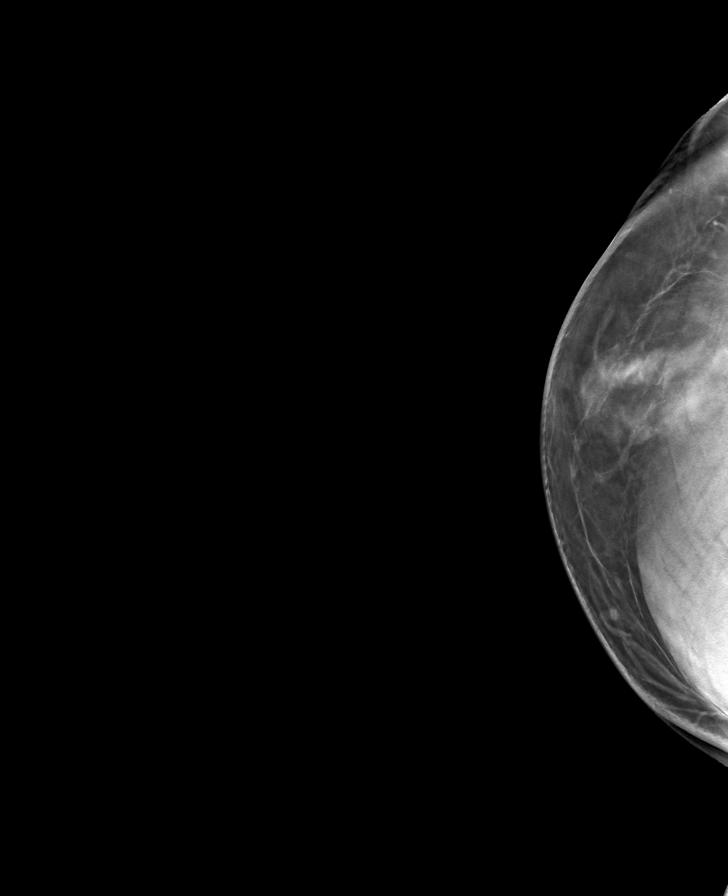

[R MLO tomo · tomo slice 31/62.0]
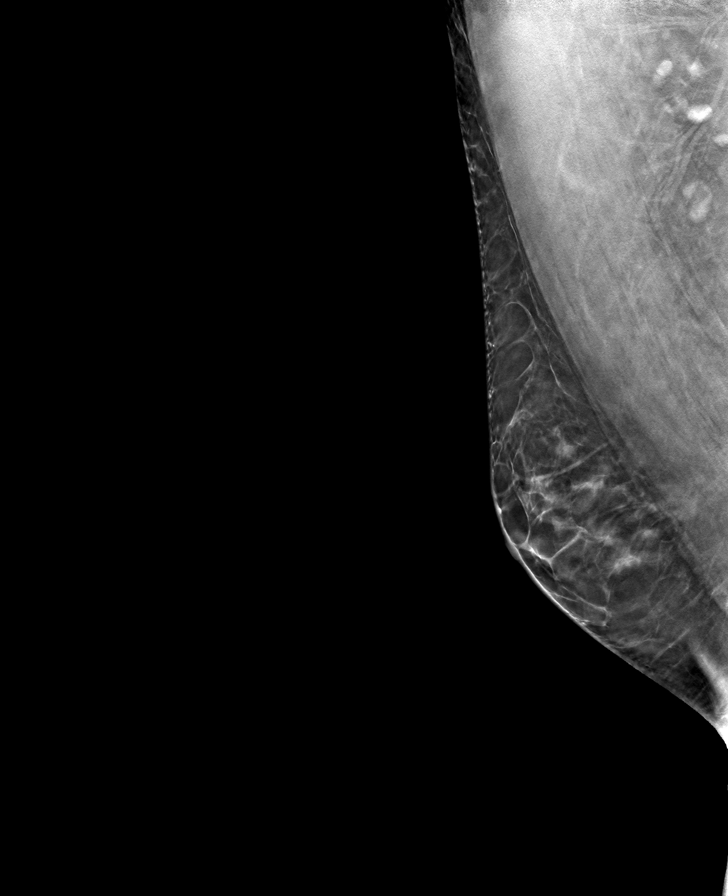

[L CC tomo · tomo slice 37/72.0]
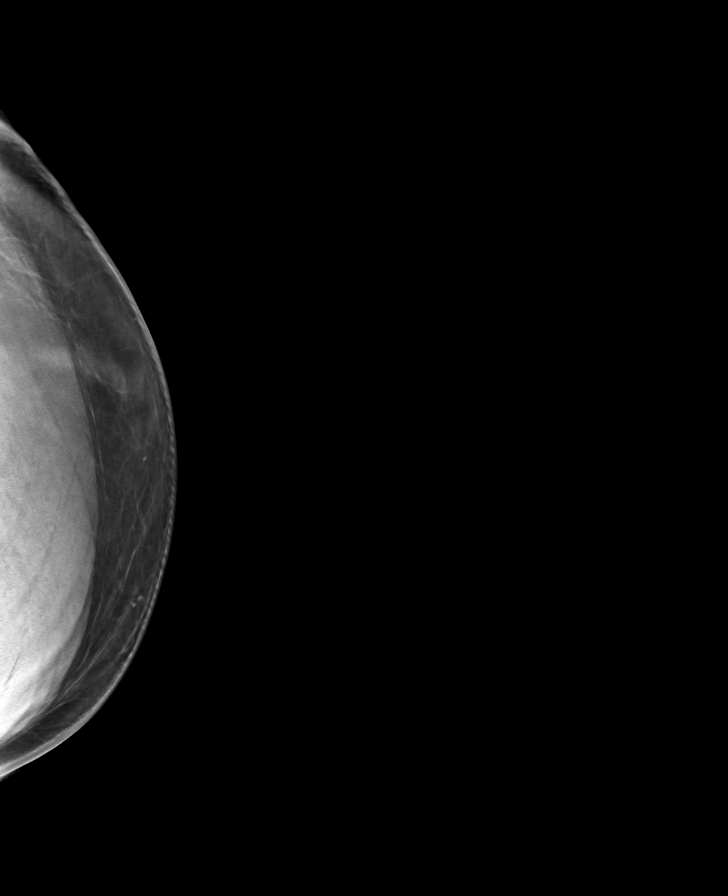

[8 of 24 positions shown; findings below may reference images not displayed]

ACR Breast Density Category b: There are scattered areas of
fibroglandular density.
FINDINGS: There is moderate right and mild left gynecomastia. No suspicious
mass, microcalcification, or other finding is identified in either
breast.

Mammographic images were processed with CAD.
IMPRESSION: Moderate right and mild left gynecomastia. No evidence of
malignancy.

RECOMMENDATION:
No further imaging workup is recommended.

I have discussed the findings and recommendations with the patient.

BI-RADS CATEGORY  2: Benign.

## 2021-10-25 DIAGNOSIS — F17200 Nicotine dependence, unspecified, uncomplicated: Secondary | ICD-10-CM | POA: Diagnosis not present

## 2021-10-25 DIAGNOSIS — Z125 Encounter for screening for malignant neoplasm of prostate: Secondary | ICD-10-CM | POA: Diagnosis not present

## 2021-10-25 DIAGNOSIS — Z13220 Encounter for screening for lipoid disorders: Secondary | ICD-10-CM | POA: Diagnosis not present

## 2021-10-25 DIAGNOSIS — K429 Umbilical hernia without obstruction or gangrene: Secondary | ICD-10-CM | POA: Diagnosis not present

## 2021-10-25 DIAGNOSIS — K6289 Other specified diseases of anus and rectum: Secondary | ICD-10-CM | POA: Diagnosis not present

## 2021-10-25 DIAGNOSIS — Z0000 Encounter for general adult medical examination without abnormal findings: Secondary | ICD-10-CM | POA: Diagnosis not present

## 2021-10-25 DIAGNOSIS — Z131 Encounter for screening for diabetes mellitus: Secondary | ICD-10-CM | POA: Diagnosis not present

## 2021-10-26 ENCOUNTER — Other Ambulatory Visit: Payer: Self-pay | Admitting: Family Medicine

## 2021-10-26 DIAGNOSIS — F17200 Nicotine dependence, unspecified, uncomplicated: Secondary | ICD-10-CM

## 2021-10-27 DIAGNOSIS — Z1211 Encounter for screening for malignant neoplasm of colon: Secondary | ICD-10-CM | POA: Diagnosis not present

## 2021-10-27 DIAGNOSIS — Z01818 Encounter for other preprocedural examination: Secondary | ICD-10-CM | POA: Diagnosis not present

## 2021-10-29 ENCOUNTER — Other Ambulatory Visit: Payer: Self-pay

## 2021-10-29 ENCOUNTER — Encounter (HOSPITAL_COMMUNITY): Payer: Self-pay

## 2021-10-29 ENCOUNTER — Emergency Department (HOSPITAL_COMMUNITY)
Admission: EM | Admit: 2021-10-29 | Discharge: 2021-10-29 | Disposition: A | Discharge status: Home / Self Care | Payer: BC Managed Care – PPO | Attending: Emergency Medicine | Admitting: Emergency Medicine

## 2021-10-29 DIAGNOSIS — K612 Anorectal abscess: Secondary | ICD-10-CM

## 2021-10-29 DIAGNOSIS — K6289 Other specified diseases of anus and rectum: Secondary | ICD-10-CM | POA: Diagnosis not present

## 2021-10-29 DIAGNOSIS — K610 Anal abscess: Secondary | ICD-10-CM | POA: Insufficient documentation

## 2021-10-29 MED ORDER — LIDOCAINE-EPINEPHRINE-TETRACAINE (LET) TOPICAL GEL
3.0000 mL | Freq: Once | TOPICAL | Status: AC
Start: 2021-10-29 — End: 2021-10-29
  Administered 2021-10-29: 3 mL via TOPICAL
  Filled 2021-10-29: qty 3

## 2021-10-29 MED ORDER — MORPHINE SULFATE (PF) 4 MG/ML IV SOLN
4.0000 mg | Freq: Once | INTRAVENOUS | Status: AC
  Administered 2021-10-29: 4 mg via INTRAVENOUS
  Filled 2021-10-29: qty 1

## 2021-10-29 MED ORDER — LIDOCAINE-EPINEPHRINE (PF) 2 %-1:200000 IJ SOLN
20.0000 mL | Freq: Once | INTRAMUSCULAR | Status: AC
  Administered 2021-10-29: 20 mL
  Filled 2021-10-29: qty 20

## 2021-10-29 MED ORDER — HYDROCODONE-ACETAMINOPHEN 5-325 MG PO TABS
2.0000 | ORAL_TABLET | Freq: Once | ORAL | Status: AC
  Administered 2021-10-29: 2 via ORAL
  Filled 2021-10-29: qty 2

## 2021-10-29 MED ORDER — AMOXICILLIN-POT CLAVULANATE 875-125 MG PO TABS: 1.0000 | ORAL_TABLET | Freq: Two times a day (BID) | ORAL | 0 refills | Status: AC

## 2021-10-29 NOTE — ED Triage Notes (Signed)
Patient complains of severe rectal pain that she thinks is internal hemorrhoid. Patient states that he is unable now to have bowel movement due to the obstruction and pain

## 2021-10-29 NOTE — Discharge Instructions (Addendum)
Diagnosed with anorectal abscess today.  Incision and drainage of your abscess went very well.  Ordered Augmentin which is an antibiotic that you should take for possible infection related to your abscess.  Finish the entire 7-day course.  Recommend that you follow-up with GI regarding your scheduled colonoscopy in 2 weeks and recent history of anorectal abscess.  If you have new fever, worsening anal rectal or abdominal pain, bloody stools please return to the emergency department for further evaluation.

## 2021-10-29 NOTE — ED Provider Notes (Signed)
[facility] EMERGENCY DEPARTMENT Provider Note   CSN: [id number] Arrival date & time: 10/29/21  0750     History  Chief Complaint  Patient presents with   Rectal Pain   HPI Ricky Williamson is a 51 y.o. male history of lumbar fusion in 2015 presenting for concern for hemorrhoids.  Started 2 days ago.  Had exquisite rectal pain.  Felt sharp and stabbing and constant.  Yesterday had a bowel movement that was extremely painful to pass.  Denies bleeding.  Was seen on Friday by [facility] who diagnosed him with moderate-sized nonthrombosed nonbleeding external hemorrhoid and administered a cortisone suppository.  Patient states that it did help but pain returned later that evening.  Unsure if he has had a fever but has had cold sweats.  He has tried sits baths and hot showers and Advil to alleviate his symptoms but the pain is much worse even today in comparison to Friday.  Patient also concerned that he may not be able to have a bowel movement due to obstruction and pain.      Home Medications Prior to Admission medications   Medication Sig Start Date End Date Taking? Authorizing Provider  amoxicillin-clavulanate (AUGMENTIN) 875-125 MG tablet Take 1 tablet by mouth every 12 (twelve) hours. 10/29/21  Yes Harriet Pho, PA-C  diazepam (VALIUM) 5 MG tablet Take 1-2 tablets (5-10 mg total) by mouth every 6 (six) hours as needed for muscle spasms. 10/27/13   Earnie Larsson, MD  Fexofenadine HCl (ALLEGRA PO) Take 1 tablet by mouth daily.    [provider]  ibuprofen (ADVIL,MOTRIN) 200 MG tablet Take 800 mg by mouth every 6 (six) hours as needed for moderate pain.    [provider]  naproxen sodium (ANAPROX) 220 MG tablet Take 440 mg by mouth 2 (two) times daily as needed (for pain).    [provider]  oxyCODONE-acetaminophen (PERCOCET) 10-325 MG per tablet Take 1-2 tablets by mouth every 4 (four) hours as needed for pain. 10/27/13   Earnie Larsson, MD   Tetrahydrozoline HCl (VISINE OP) Place 3 drops into both eyes daily as needed (for allergies).    [provider]      Allergies    Patient has no known allergies.    Review of Systems   Review of Systems  Gastrointestinal:        Anal rectal pain    Physical Exam Updated Vital Signs BP 108/73   Pulse 79   Temp 98.2 F (36.8 C) (Oral)   Resp 18   SpO2 95%  Physical Exam Constitutional:      Appearance: Normal appearance.  HENT:     Head: Normocephalic.     Nose: Nose normal.  Eyes:     Conjunctiva/sclera: Conjunctivae normal.  Pulmonary:     Effort: Pulmonary effort is normal.  Genitourinary:    Comments: External rectal exam revealed exquisitely tender, swollen, fluctuant mass circumferentially about the anus.  Neurological:     Mental Status: He is alert.  Psychiatric:        Mood and Affect: Mood normal.     ED Results / Procedures / Treatments   Labs (all labs ordered are listed, but only abnormal results are displayed) Labs Reviewed - No data to display  EKG None  Radiology No results found.  Procedures .Marland KitchenIncision and Drainage  Date/Time: 10/29/2021 11:41 AM  Performed by: Harriet Pho, PA-C Authorized by: Harriet Pho, PA-C   Consent:  Consent obtained:  Verbal   Risks discussed:  Bleeding, incomplete drainage and infection Universal protocol:    Procedure explained and questions answered to patient or proxy's satisfaction: yes     Patient identity confirmed:  Verbally with patient and arm band Location:    Type:  Abscess   Size:  2 cm   Location:  Anogenital   Anogenital location:  Perianal Pre-procedure details:    Skin preparation:  Chlorhexidine Sedation:    Sedation type:  None Anesthesia:    Anesthesia method:  Local infiltration   Local anesthetic:  Lidocaine 2% WITH epi Procedure type:    Complexity:  Simple Procedure details:    Incision types:  Stab incision   Incision depth:  Subcutaneous   Wound  management:  Probed and deloculated   Drainage:  Purulent and bloody   Drainage amount:  Moderate   Wound treatment:  Wound left open   Packing materials:  1/4 in iodoform gauze Post-procedure details:    Procedure completion:  Tolerated well, no immediate complications     Medications Ordered in ED Medications  HYDROcodone-acetaminophen (NORCO/VICODIN) 5-325 MG per tablet 2 tablet (2 tablets Oral Given 10/29/21 0908)  lidocaine-EPINEPHrine (XYLOCAINE W/EPI) 2 %-1:200000 (PF) injection 20 mL (20 mLs Infiltration Given by Other 10/29/21 1038)  lidocaine-EPINEPHrine-tetracaine (LET) topical gel (3 mLs Topical Given 10/29/21 1057)  morphine (PF) 4 MG/ML injection 4 mg (4 mg Intravenous Given 10/29/21 1037)    ED Course/ Medical Decision Making/ A&P                           Medical Decision Making Risk Prescription drug management.   Patient presented for anal rectal pain and concern for hemorrhoid.  Exam did not reveal concern for hemorrhoidal but rather moderately sized anal rectal abscess.  Before I&D treated pain with hydrocodone and then later morphine.  Overall patient tolerated I&D well.  See procedure note above for details.  Able to express approximately 10 mL of purulent discharge. Ordered 7-day course of Augmentin for empiric coverage of possible infection related to his abscess.  Advised him to discontinue the steroid suppositories.  Recommended that he follow-up with GI regarding anal rectal abscess and scheduling of his preventative colonoscopy which is occurring in 2 weeks.        Final Clinical Impression(s) / ED Diagnoses Final diagnoses:  Anorectal abscess    Rx / DC Orders ED Discharge Orders          Ordered    amoxicillin-clavulanate (AUGMENTIN) 875-125 MG tablet  Every 12 hours        10/29/21 1145              Gareth Eagle, PA-C 10/29/21 1147    Gwyneth Sprout, MD 11/01/21 1326

## 2021-11-09 DIAGNOSIS — K610 Anal abscess: Secondary | ICD-10-CM | POA: Diagnosis not present

## 2021-11-09 DIAGNOSIS — D124 Benign neoplasm of descending colon: Secondary | ICD-10-CM | POA: Diagnosis not present

## 2021-11-09 DIAGNOSIS — D128 Benign neoplasm of rectum: Secondary | ICD-10-CM | POA: Diagnosis not present

## 2021-11-09 DIAGNOSIS — Z1211 Encounter for screening for malignant neoplasm of colon: Secondary | ICD-10-CM | POA: Diagnosis not present

## 2021-11-09 DIAGNOSIS — D125 Benign neoplasm of sigmoid colon: Secondary | ICD-10-CM | POA: Diagnosis not present

## 2021-11-09 DIAGNOSIS — K5730 Diverticulosis of large intestine without perforation or abscess without bleeding: Secondary | ICD-10-CM | POA: Diagnosis not present

## 2021-11-09 DIAGNOSIS — D123 Benign neoplasm of transverse colon: Secondary | ICD-10-CM | POA: Diagnosis not present

## 2021-12-01 ENCOUNTER — Ambulatory Visit
Admission: RE | Admit: 2021-12-01 | Discharge: 2021-12-01 | Disposition: A | Discharge status: Home / Self Care | Payer: BC Managed Care – PPO | Source: Ambulatory Visit | Attending: Family Medicine | Admitting: Family Medicine

## 2021-12-01 DIAGNOSIS — F17210 Nicotine dependence, cigarettes, uncomplicated: Secondary | ICD-10-CM | POA: Diagnosis not present

## 2021-12-01 DIAGNOSIS — F17200 Nicotine dependence, unspecified, uncomplicated: Secondary | ICD-10-CM

## 2021-12-07 ENCOUNTER — Other Ambulatory Visit: Payer: Self-pay | Admitting: Family Medicine

## 2021-12-07 DIAGNOSIS — N2889 Other specified disorders of kidney and ureter: Secondary | ICD-10-CM

## 2022-01-05 ENCOUNTER — Ambulatory Visit
Admission: RE | Admit: 2022-01-05 | Discharge: 2022-01-05 | Disposition: A | Discharge status: Home / Self Care | Payer: BC Managed Care – PPO | Source: Ambulatory Visit | Attending: Family Medicine | Admitting: Family Medicine

## 2022-01-05 DIAGNOSIS — N281 Cyst of kidney, acquired: Secondary | ICD-10-CM | POA: Diagnosis not present

## 2022-01-05 DIAGNOSIS — N2889 Other specified disorders of kidney and ureter: Secondary | ICD-10-CM

## 2022-01-05 MED ORDER — GADOPICLENOL 0.5 MMOL/ML IV SOLN
10.0000 mL | Freq: Once | INTRAVENOUS | Status: AC | PRN
  Administered 2022-01-05: 10 mL via INTRAVENOUS

## 2022-01-08 ENCOUNTER — Other Ambulatory Visit: Payer: BC Managed Care – PPO
